# Patient Record
Sex: Female | Born: 1955 | Race: White | Hispanic: No | Smoking: Former smoker
Health system: Southern US, Community
[De-identification: ages and names within clinical notes are randomized; demographics above are authoritative.]

## PROBLEM LIST (undated history)

## (undated) DIAGNOSIS — M1990 Unspecified osteoarthritis, unspecified site: Secondary | ICD-10-CM

## (undated) HISTORY — PX: BACK SURGERY: SHX140

## (undated) HISTORY — PX: BREAST SURGERY: SHX581

---

## 2008-05-17 ENCOUNTER — Emergency Department (HOSPITAL_BASED_OUTPATIENT_CLINIC_OR_DEPARTMENT_OTHER): Admission: EM | Admit: 2008-05-17 | Discharge: 2008-05-17 | Payer: Self-pay | Admitting: Emergency Medicine

## 2014-11-23 ENCOUNTER — Emergency Department (HOSPITAL_BASED_OUTPATIENT_CLINIC_OR_DEPARTMENT_OTHER): Payer: 59

## 2014-11-23 ENCOUNTER — Emergency Department (HOSPITAL_BASED_OUTPATIENT_CLINIC_OR_DEPARTMENT_OTHER)
Admission: EM | Admit: 2014-11-23 | Discharge: 2014-11-24 | Disposition: A | Discharge status: Home / Self Care | Payer: 59 | Attending: Emergency Medicine | Admitting: Emergency Medicine

## 2014-11-23 ENCOUNTER — Encounter (HOSPITAL_BASED_OUTPATIENT_CLINIC_OR_DEPARTMENT_OTHER): Payer: Self-pay | Admitting: *Deleted

## 2014-11-23 DIAGNOSIS — R61 Generalized hyperhidrosis: Secondary | ICD-10-CM | POA: Insufficient documentation

## 2014-11-23 DIAGNOSIS — R0789 Other chest pain: Secondary | ICD-10-CM | POA: Diagnosis not present

## 2014-11-23 DIAGNOSIS — Z87891 Personal history of nicotine dependence: Secondary | ICD-10-CM | POA: Diagnosis not present

## 2014-11-23 DIAGNOSIS — M1990 Unspecified osteoarthritis, unspecified site: Secondary | ICD-10-CM | POA: Insufficient documentation

## 2014-11-23 DIAGNOSIS — M79601 Pain in right arm: Secondary | ICD-10-CM | POA: Diagnosis present

## 2014-11-23 DIAGNOSIS — R0602 Shortness of breath: Secondary | ICD-10-CM | POA: Diagnosis not present

## 2014-11-23 DIAGNOSIS — L089 Local infection of the skin and subcutaneous tissue, unspecified: Secondary | ICD-10-CM | POA: Insufficient documentation

## 2014-11-23 HISTORY — DX: Unspecified osteoarthritis, unspecified site: M19.90

## 2014-11-23 LAB — CBC WITH DIFFERENTIAL/PLATELET
BASOS ABS: 0 10*3/uL (ref 0.0–0.1)
BASOS PCT: 0 %
EOS ABS: 0.1 10*3/uL (ref 0.0–0.7)
Eosinophils Relative: 1 %
HCT: 44 % (ref 36.0–46.0)
HEMOGLOBIN: 14.8 g/dL (ref 12.0–15.0)
Lymphocytes Relative: 21 %
Lymphs Abs: 2.8 10*3/uL (ref 0.7–4.0)
MCH: 31.4 pg (ref 26.0–34.0)
MCHC: 33.6 g/dL (ref 30.0–36.0)
MCV: 93.2 fL (ref 78.0–100.0)
Monocytes Absolute: 1 10*3/uL (ref 0.1–1.0)
Monocytes Relative: 8 %
NEUTROS PCT: 70 %
Neutro Abs: 9.4 10*3/uL — ABNORMAL HIGH (ref 1.7–7.7)
PLATELETS: 202 10*3/uL (ref 150–400)
RBC: 4.72 MIL/uL (ref 3.87–5.11)
RDW: 12.5 % (ref 11.5–15.5)
WBC: 13.2 10*3/uL — AB (ref 4.0–10.5)

## 2014-11-23 LAB — BASIC METABOLIC PANEL
Anion gap: 6 (ref 5–15)
BUN: 19 mg/dL (ref 6–20)
CHLORIDE: 105 mmol/L (ref 101–111)
CO2: 28 mmol/L (ref 22–32)
Calcium: 8.7 mg/dL — ABNORMAL LOW (ref 8.9–10.3)
Creatinine, Ser: 1.02 mg/dL — ABNORMAL HIGH (ref 0.44–1.00)
GFR calc non Af Amer: 59 mL/min — ABNORMAL LOW (ref >60)
Glucose, Bld: 105 mg/dL — ABNORMAL HIGH (ref 65–99)
POTASSIUM: 3.4 mmol/L — AB (ref 3.5–5.1)
SODIUM: 139 mmol/L (ref 135–145)

## 2014-11-23 LAB — TROPONIN I

## 2014-11-23 MED ORDER — CEPHALEXIN 250 MG PO CAPS
500.0000 mg | ORAL_CAPSULE | Freq: Once | ORAL | Status: AC
  Administered 2014-11-23: 500 mg via ORAL
  Filled 2014-11-23: qty 2

## 2014-11-23 MED ORDER — SULFAMETHOXAZOLE-TRIMETHOPRIM 800-160 MG PO TABS
1.0000 | ORAL_TABLET | Freq: Once | ORAL | Status: AC
  Administered 2014-11-23: 1 via ORAL
  Filled 2014-11-23: qty 1

## 2014-11-23 NOTE — ED Provider Notes (Addendum)
CSN: [id number]     Arrival date & time 11/23/14  2002 History  By signing my name below, I, Budd Palmer, attest that this documentation has been prepared under the direction and in the presence of Shon Baton, MD. Electronically Signed: Budd Palmer, ED Scribe. 11/23/2014. 11:21 PM.    Chief Complaint  Patient presents with  . Arm Pain   The history is provided by the patient. No language interpreter was used.   HPI Comments: Stacey Ballard is a 59 y.o. female former smoker (quit 10 years ago) who presents to the Emergency Department complaining of right arm pain onset earlier today. Pt states she noticed a splinter in her right middle finger. She reports she used a needle to remove it this morning. She notes she did not sterilize the needle. She states that later in the day she noticed red streaks going up her right forearm. She reports associated pain all along the right arm and into the right axilla.  She also c/o tight, intermittent, pressure-like CP onset 3 weeks ago. She describes it as a tightness between her shoulder blades, slowly radiating to the front of her chest and up her neck. She notes each episode lasted about 30 minutes. She states that the first two episodes occurred while she was at rest. She notes her most recent (3rd) episode occurred today around lunch-time. She has not seen her PCP for this because she assumed it was indigestion. She reports associated SOB, and diaphoresis to the palms of her hands. She notes exacerbation of the tightness with deep breathing. She denies a PMHx of HTN and high cholesterol. She states she has never had a stress test or cardia evaluation. Pt denies fever.  Past Medical History  Diagnosis Date  . Arthritis    Past Surgical History  Procedure Laterality Date  . Breast surgery    . Back surgery     No family history on file. Social History  Substance Use Topics  . Smoking status: Former Smoker    Quit date: 11/22/2004   . Smokeless tobacco: None  . Alcohol Use: Yes     Comment: daily   OB History    No data available     Review of Systems  Constitutional: Negative for fever.  Respiratory: Negative for chest tightness and shortness of breath.   Cardiovascular: Positive for chest pain.  Gastrointestinal: Negative for nausea, vomiting and abdominal pain.  Genitourinary: Negative for dysuria.  Skin: Positive for color change and wound.  Neurological: Negative for headaches.  All other systems reviewed and are negative.   Allergies  Codeine  Home Medications   Prior to Admission medications   Medication Sig Start Date End Date Taking? Authorizing Provider  cephALEXin (KEFLEX) 500 MG capsule Take 1 capsule (500 mg total) by mouth 4 (four) times daily. 11/24/14   Shon Baton, MD  fluconazole (DIFLUCAN) 150 MG tablet Take 1 tablet (150 mg total) by mouth once. 11/24/14 12/01/14  Shon Baton, MD  sulfamethoxazole-trimethoprim (BACTRIM DS,SEPTRA DS) 800-160 MG tablet Take 1 tablet by mouth 2 (two) times daily. 11/24/14   Shon Baton, MD   BP 106/60 mmHg  Pulse 86  Temp(Src) 98.1 F (36.7 C) (Oral)  Resp 16  Ht  (1.727 m)  Wt 144 lb (65.318 kg)  BMI 21.90 kg/m2  SpO2 98% Physical Exam  Constitutional: She is oriented to person, place, and time. She appears well-developed and well-nourished. No distress.  HENT:  Head: Normocephalic and  atraumatic.  Eyes: Pupils are equal, round, and reactive to light.  Neck: Neck supple.  Cardiovascular: Normal rate, regular rhythm and normal heart sounds.   No murmur heard. Pulmonary/Chest: Effort normal and breath sounds normal. No respiratory distress. She has no wheezes. She exhibits no tenderness.  Abdominal: Soft. Bowel sounds are normal. There is no tenderness. There is no rebound.  Musculoskeletal:  Normal flexion and extension of the DIP and PIP joints of the right hand  Neurological: She is alert and oriented to person, place,  and time.  Skin: Skin is warm and dry.  Punctate wound noted over the lateral aspect of the pad of the third digit on the right hand, there is swelling, erythema, and tenderness to palpation limited to the pad of that digit, proximally there is streaking up the forearm, 2+ radial pulse, no palpable felon or abscess  Psychiatric: She has a normal mood and affect.  Nursing note and vitals reviewed.   ED Course  Procedures  DIAGNOSTIC STUDIES: Oxygen Saturation is 100% on RA, normal by my interpretation.    COORDINATION OF CARE: 11:16 PM - Discussed probable infection. Discussed plans to order a chest XR and antibiotics. Will refer to a cardiologist for f/u and stress test. Pt advised of plan for treatment and pt agrees.  Labs Review Labs Reviewed  CBC WITH DIFFERENTIAL/PLATELET - Abnormal; Notable for the following:    WBC 13.2 (*)    Neutro Abs 9.4 (*)    All other components within normal limits  BASIC METABOLIC PANEL - Abnormal; Notable for the following:    Potassium 3.4 (*)    Glucose, Bld 105 (*)    Creatinine, Ser 1.02 (*)    Calcium 8.7 (*)    GFR calc non Af Amer 59 (*)    All other components within normal limits  TROPONIN I    Imaging Review Dg Chest 2 View  11/23/2014  CLINICAL DATA:  59 year old female with intermittent left-sided back pain radiating to the left right-sided chest for the past 3 weeks. Chest tightness. Former smoker. EXAM: CHEST  2 VIEW COMPARISON:  No priors. FINDINGS: Lung volumes are normal. No consolidative airspace disease. No pleural effusions. No pneumothorax. No pulmonary nodule or mass noted. Pulmonary vasculature and the cardiomediastinal silhouette are within normal limits. Bilateral breast implants incidentally noted. IMPRESSION: 1.  No radiographic evidence of acute cardiopulmonary disease. Electronically Signed   By: Trudie Reedaniel  Entrikin M.D.   On: 11/23/2014 23:36   I have personally reviewed and evaluated these images and lab results as part  of my medical decision-making.   EKG Interpretation   Date/Time:  Monday November 23 2014 20:18:13 EDT Ventricular Rate:  94 PR Interval:  152 QRS Duration: 82 QT Interval:  350 QTC Calculation: 437 R Axis:   82 Text Interpretation:  Normal sinus rhythm with sinus arrhythmia  Nonspecific ST abnormality Abnormal ECG No prior for comparison Confirmed  by Sharian Delia  MD, Bryn Saline ([id number]) on 11/23/2014 10:56:33 PM      MDM   Final diagnoses:  Finger infection  Other chest pain    Patient presents with concern for infection to the right finger. She also reports intermittent history of chest pain over the last several weeks. Currently chest pain-free. Nontoxic otherwise. Only risk factor for heart disease is former smoking. EKG is nonischemic. Chest x-ray is reassuring and troponin is negative.  Patient does have evidence of localized infection over the right distal third digit. No drainable infection noted at this time.  Mild leukocytosis to 13.2. Otherwise the patient is afebrile. Infection appears superficial this time. No evidence of tenosynovitis or deep space infection or abscess/felon.  Patient was given Keflex and Bactrim. Will discharge with antibiotics. Discussed with patient return precautions including increasing pain, worsening swelling of the digit, difficulty with range of motion of her fingers, or development of fever. Patient stated understanding. Regarding patient's chest pain, she will follow-up with cardiology for outpatient stress testing.  After history, exam, and medical workup I feel the patient has been appropriately medically screened and is safe for discharge home. Pertinent diagnoses were discussed with the patient. Patient was given return precautions.   I personally performed the services described in this documentation, which was scribed in my presence. The recorded information has been reviewed and is accurate.   Shon Baton, MD 11/24/14 1610  Shon Baton, MD 11/24/14 [id number]

## 2014-11-23 NOTE — ED Notes (Signed)
She took a splinter out of her right middle finger earlier today. Without 45 minutes her had red streaks going up her arm. Muscle aches. She also stated that during to past 3 weeks she has had pain in her shoulders with radiation into her upper back and neck. Her hands got sweaty during the pain.

## 2014-11-24 MED ORDER — SULFAMETHOXAZOLE-TRIMETHOPRIM 800-160 MG PO TABS: 1.0000 | ORAL_TABLET | Freq: Two times a day (BID) | ORAL | Status: AC

## 2014-11-24 MED ORDER — CEPHALEXIN 500 MG PO CAPS: 500.0000 mg | ORAL_CAPSULE | Freq: Four times a day (QID) | ORAL | Status: AC

## 2014-11-24 MED ORDER — FLUCONAZOLE 150 MG PO TABS: 150.0000 mg | ORAL_TABLET | Freq: Once | ORAL | Status: AC

## 2014-11-24 NOTE — Discharge Instructions (Signed)
You were seen today for infection of your finger. He will be discharged with antibiotics. You need to make sure to monitor for increasing signs and symptoms of infection. You could develop what is called a Felon which is a deeper space infection in the finger. If you have any new or worsening symptoms she needs to be reevaluated immediately.   You were also seen for chest pain.  You are low risk but need to follow-up with cardiology as an outpatient for stress testing.   Cellulitis Cellulitis is an infection of the skin and the tissue beneath it. The infected area is usually red and tender. Cellulitis occurs most often in the arms and lower legs.  CAUSES  Cellulitis is caused by bacteria that enter the skin through cracks or cuts in the skin. The most common types of bacteria that cause cellulitis are staphylococci and streptococci. SIGNS AND SYMPTOMS   Redness and warmth.  Swelling.  Tenderness or pain.  Fever. DIAGNOSIS  Your health care provider can usually determine what is wrong based on a physical exam. Blood tests may also be done. TREATMENT  Treatment usually involves taking an antibiotic medicine. HOME CARE INSTRUCTIONS   Take your antibiotic medicine as directed by your health care provider. Finish the antibiotic even if you start to feel better.  Keep the infected arm or leg elevated to reduce swelling.  Apply a warm cloth to the affected area up to 4 times per day to relieve pain.  Take medicines only as directed by your health care provider.  Keep all follow-up visits as directed by your health care provider. SEEK MEDICAL CARE IF:   You notice red streaks coming from the infected area.  Your red area gets larger or turns dark in color.  Your bone or joint underneath the infected area becomes painful after the skin has healed.  Your infection returns in the same area or another area.  You notice a swollen bump in the infected area.  You develop new  symptoms.  You have a fever. SEEK IMMEDIATE MEDICAL CARE IF:   You feel very sleepy.  You develop vomiting or diarrhea.  You have a general ill feeling (malaise) with muscle aches and pains.   This information is not intended to replace advice given to you by your health care provider. Make sure you discuss any questions you have with your health care provider.   Document Released: 10/26/2004 Document Revised: 10/07/2014 Document Reviewed: 04/03/2011 Elsevier Interactive Patient Education 2016 [employer] Nonspecific Chest Pain  Chest pain can be caused by many different conditions. There is always a chance that your pain could be related to something serious, such as a heart attack or a blood clot in your lungs. Chest pain can also be caused by conditions that are not life-threatening. If you have chest pain, it is very important to follow up with your health care provider. CAUSES  Chest pain can be caused by:  Heartburn.  Pneumonia or bronchitis.  Anxiety or stress.  Inflammation around your heart (pericarditis) or lung (pleuritis or pleurisy).  A blood clot in your lung.  A collapsed lung (pneumothorax). It can develop suddenly on its own (spontaneous pneumothorax) or from trauma to the chest.  Shingles infection (varicella-zoster virus).  Heart attack.  Damage to the bones, muscles, and cartilage that make up your chest wall. This can include:  Bruised bones due to injury.  Strained muscles or cartilage due to frequent or repeated coughing or overwork.  Fracture  to one or more ribs.  Sore cartilage due to inflammation (costochondritis). RISK FACTORS  Risk factors for chest pain may include:  Activities that increase your risk for trauma or injury to your chest.  Respiratory infections or conditions that cause frequent coughing.  Medical conditions or overeating that can cause heartburn.  Heart disease or family history of heart disease.  Conditions or  health behaviors that increase your risk of developing a blood clot.  Having had chicken pox (varicella zoster). SIGNS AND SYMPTOMS Chest pain can feel like:  Burning or tingling on the surface of your chest or deep in your chest.  Crushing, pressure, aching, or squeezing pain.  Dull or sharp pain that is worse when you move, cough, or take a deep breath.  Pain that is also felt in your back, neck, shoulder, or arm, or pain that spreads to any of these areas. Your chest pain may come and go, or it may stay constant. DIAGNOSIS Lab tests or other studies may be needed to find the cause of your pain. Your health care provider may have you take a test called an ambulatory ECG (electrocardiogram). An ECG records your heartbeat patterns at the time the test is performed. You may also have other tests, such as:  Transthoracic echocardiogram (TTE). During echocardiography, sound waves are used to create a picture of all of the heart structures and to look at how blood flows through your heart.  Transesophageal echocardiogram (TEE).This is a more advanced imaging test that obtains images from inside your body. It allows your health care provider to see your heart in finer detail.  Cardiac monitoring. This allows your health care provider to monitor your heart rate and rhythm in real time.  Holter monitor. This is a portable device that records your heartbeat and can help to diagnose abnormal heartbeats. It allows your health care provider to track your heart activity for several days, if needed.  Stress tests. These can be done through exercise or by taking medicine that makes your heart beat more quickly.  Blood tests.  Imaging tests. TREATMENT  Your treatment depends on what is causing your chest pain. Treatment may include:  Medicines. These may include:  Acid blockers for heartburn.  Anti-inflammatory medicine.  Pain medicine for inflammatory conditions.  Antibiotic medicine, if  an infection is present.  Medicines to dissolve blood clots.  Medicines to treat coronary artery disease.  Supportive care for conditions that do not require medicines. This may include:  Resting.  Applying heat or cold packs to injured areas.  Limiting activities until pain decreases. HOME CARE INSTRUCTIONS  If you were prescribed an antibiotic medicine, finish it all even if you start to feel better.  Avoid any activities that bring on chest pain.  Do not use any tobacco products, including cigarettes, chewing tobacco, or electronic cigarettes. If you need help quitting, ask your health care provider.  Do not drink alcohol.  Take medicines only as directed by your health care provider.  Keep all follow-up visits as directed by your health care provider. This is important. This includes any further testing if your chest pain does not go away.  If heartburn is the cause for your chest pain, you may be told to keep your head raised (elevated) while sleeping. This reduces the chance that acid will go from your stomach into your esophagus.  Make lifestyle changes as directed by your health care provider. These may include:  Getting regular exercise. Ask your health care provider  to suggest some activities that are safe for you.  Eating a heart-healthy diet. A registered dietitian can help you to learn healthy eating options.  Maintaining a healthy weight.  Managing diabetes, if necessary.  Reducing stress. SEEK MEDICAL CARE IF:  Your chest pain does not go away after treatment.  You have a rash with blisters on your chest.  You have a fever. SEEK IMMEDIATE MEDICAL CARE IF:   Your chest pain is worse.  You have an increasing cough, or you cough up blood.  You have severe abdominal pain.  You have severe weakness.  You faint.  You have chills.  You have sudden, unexplained chest discomfort.  You have sudden, unexplained discomfort in your arms, back, neck, or  jaw.  You have shortness of breath at any time.  You suddenly start to sweat, or your skin gets clammy.  You feel nauseous or you vomit.  You suddenly feel light-headed or dizzy.  Your heart begins to beat quickly, or it feels like it is skipping beats. These symptoms may represent a serious problem that is an emergency. Do not wait to see if the symptoms will go away. Get medical help right away. Call your local emergency services (911 in the [place] Do not drive yourself to the hospital.   This information is not intended to replace advice given to you by your health care provider. Make sure you discuss any questions you have with your health care provider.   Document Released: 10/26/2004 Document Revised: 02/06/2014 Document Reviewed: 08/22/2013 Elsevier Interactive Patient Education [employer]

## 2017-01-24 ENCOUNTER — Ambulatory Visit: Payer: Self-pay | Admitting: Osteopathic Medicine

## 2017-04-24 IMAGING — CR DG CHEST 2V
2 series · 2 of 2 positions shown · non-contrast
Comparison: No priors.

CLINICAL DATA: 59-year-old female with intermittent left-sided back
pain radiating to the left right-sided chest for the past 3 weeks.
Chest tightness. Former smoker.

EXAM:
CHEST  2 VIEW

[w chest pa]
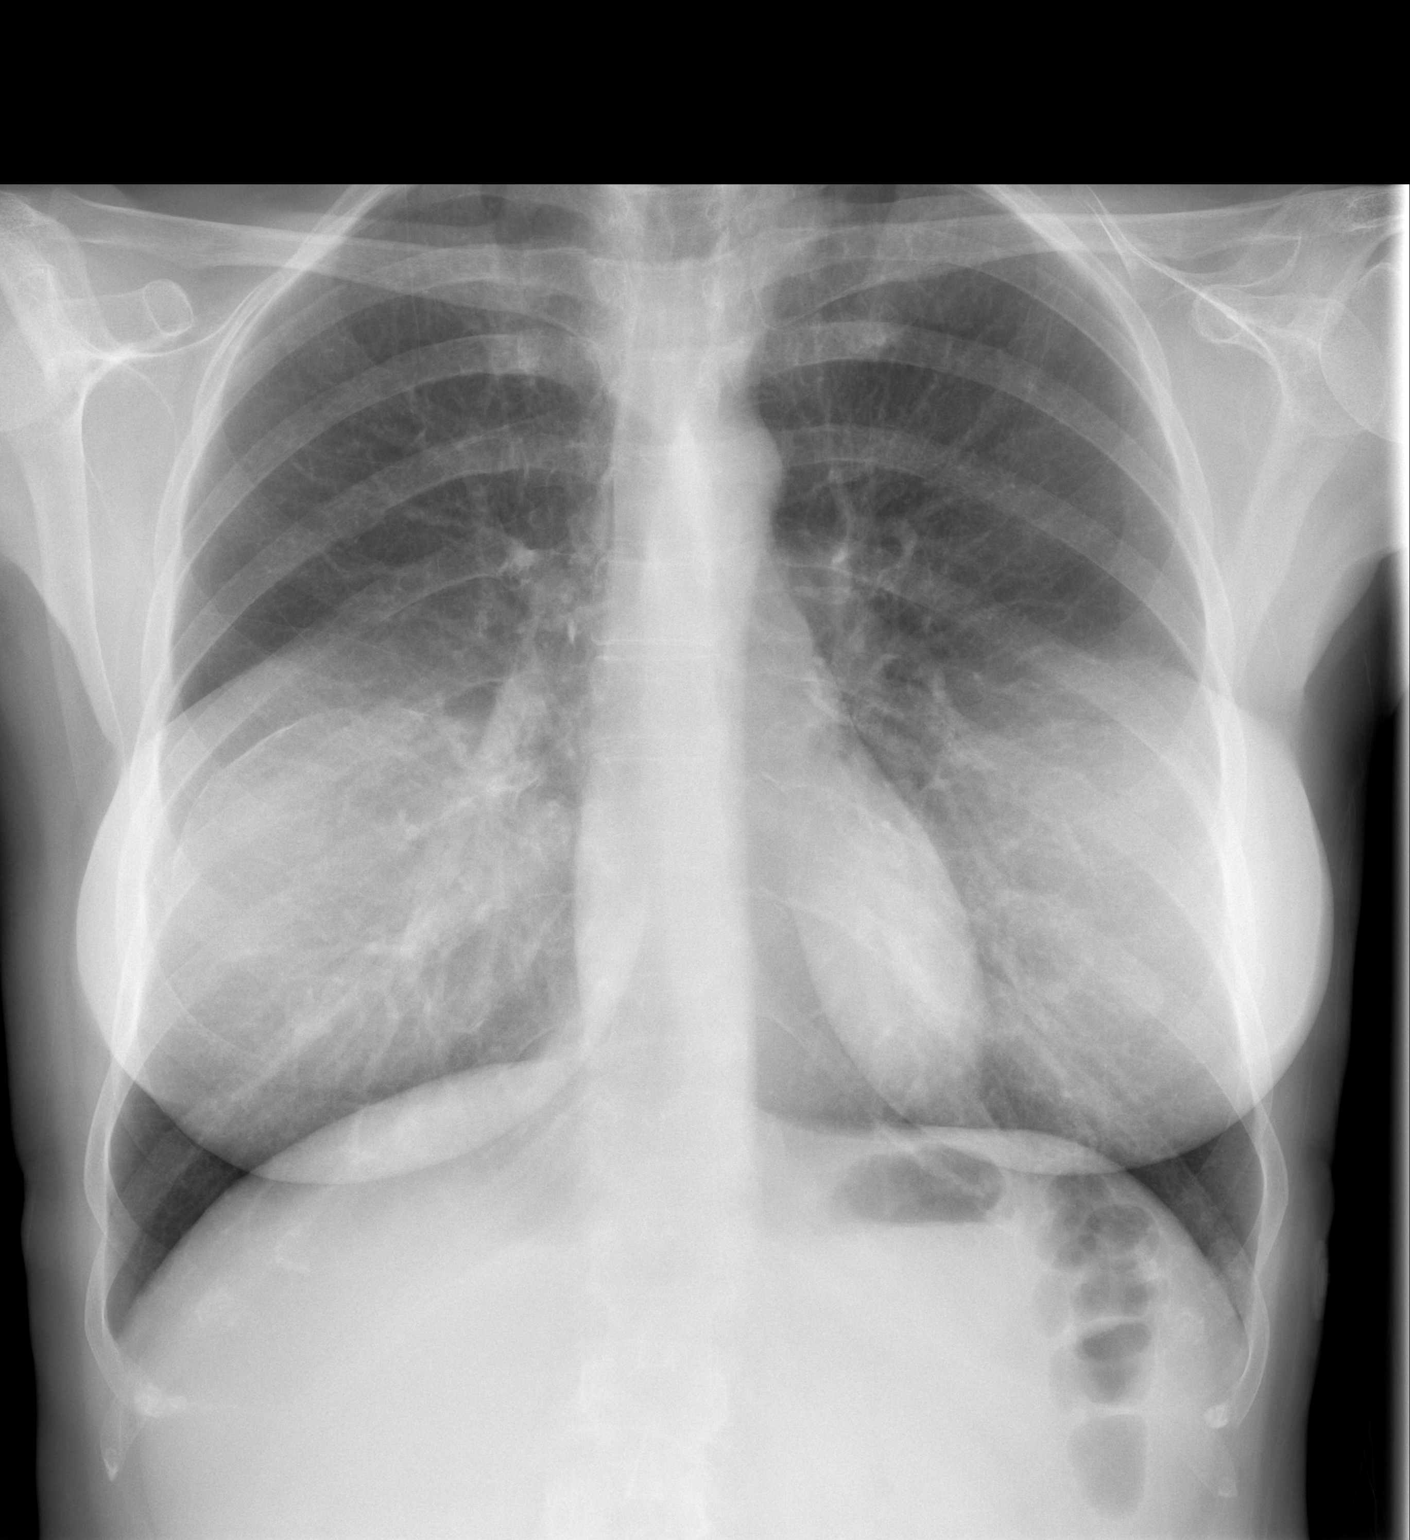

[w chest lat]
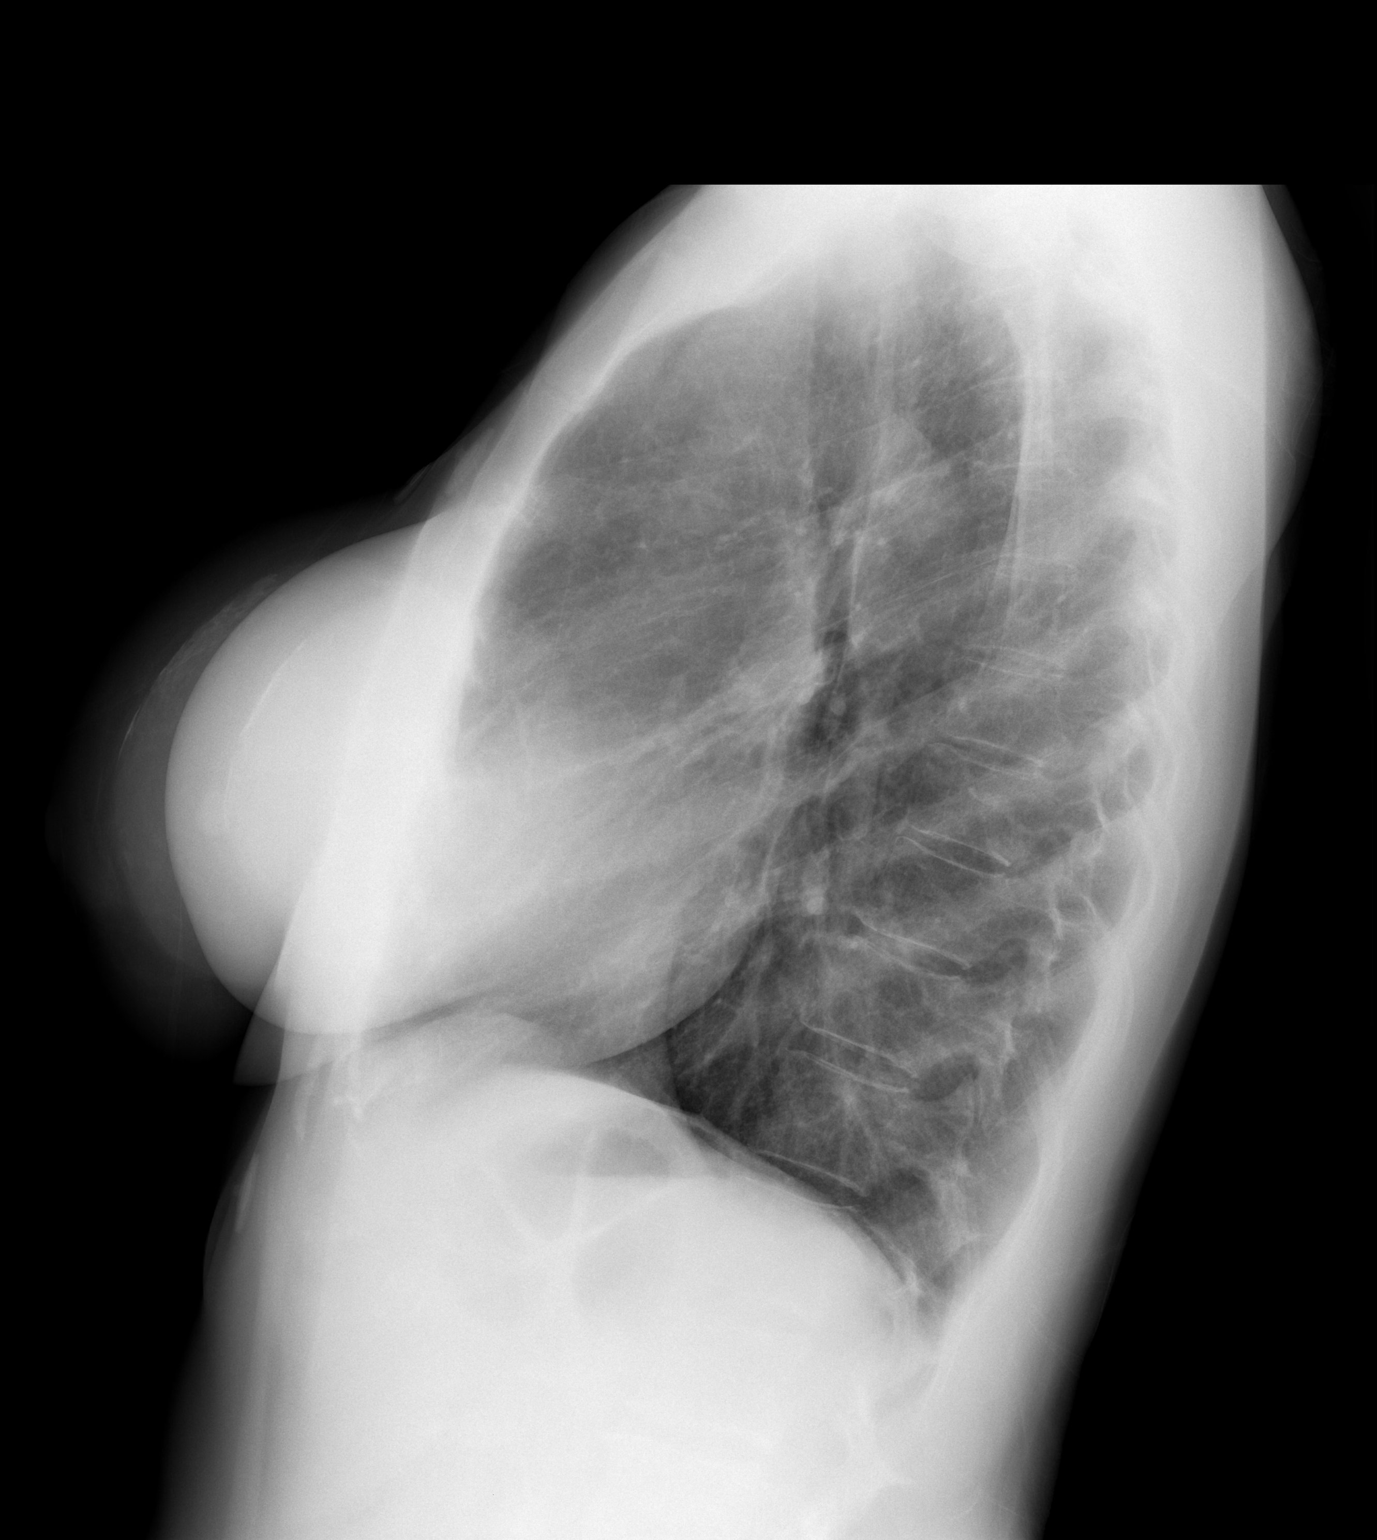

[2 of 2 positions shown; findings below may reference images not displayed]

FINDINGS: Lung volumes are normal. No consolidative airspace disease. No
pleural effusions. No pneumothorax. No pulmonary nodule or mass
noted. Pulmonary vasculature and the cardiomediastinal silhouette
are within normal limits. Bilateral breast implants incidentally
noted.
IMPRESSION: 1.  No radiographic evidence of acute cardiopulmonary disease.

## 2020-04-01 ENCOUNTER — Other Ambulatory Visit: Payer: Self-pay | Admitting: Gastroenterology

## 2020-04-01 ENCOUNTER — Other Ambulatory Visit (HOSPITAL_COMMUNITY): Payer: Self-pay | Admitting: Gastroenterology

## 2020-04-01 DIAGNOSIS — R1032 Left lower quadrant pain: Secondary | ICD-10-CM
